# Patient Record
Sex: Male | Born: 1945 | Race: White | Hispanic: No | Marital: Married | Smoking: Former smoker
Health system: Southern US, Community
[De-identification: ages and names within clinical notes are randomized; demographics above are authoritative.]

## PROBLEM LIST (undated history)

## (undated) DIAGNOSIS — G629 Polyneuropathy, unspecified: Secondary | ICD-10-CM

## (undated) HISTORY — PX: FRACTURE SURGERY: SHX138

## (undated) HISTORY — PX: EYE SURGERY: SHX253

## (undated) HISTORY — PX: HERNIA REPAIR: SHX51

---

## 2021-08-27 ENCOUNTER — Emergency Department (HOSPITAL_COMMUNITY)
Admission: EM | Admit: 2021-08-27 | Discharge: 2021-08-27 | Disposition: A | Discharge status: Home / Self Care | Payer: Medicare Other | Attending: Emergency Medicine | Admitting: Emergency Medicine

## 2021-08-27 ENCOUNTER — Emergency Department (HOSPITAL_COMMUNITY): Payer: Medicare Other

## 2021-08-27 ENCOUNTER — Encounter (HOSPITAL_COMMUNITY): Payer: Self-pay | Admitting: Emergency Medicine

## 2021-08-27 DIAGNOSIS — G8918 Other acute postprocedural pain: Secondary | ICD-10-CM | POA: Insufficient documentation

## 2021-08-27 DIAGNOSIS — D72829 Elevated white blood cell count, unspecified: Secondary | ICD-10-CM | POA: Insufficient documentation

## 2021-08-27 DIAGNOSIS — R112 Nausea with vomiting, unspecified: Secondary | ICD-10-CM | POA: Insufficient documentation

## 2021-08-27 DIAGNOSIS — R1010 Upper abdominal pain, unspecified: Secondary | ICD-10-CM | POA: Diagnosis present

## 2021-08-27 HISTORY — DX: Polyneuropathy, unspecified: G62.9

## 2021-08-27 LAB — COMPREHENSIVE METABOLIC PANEL
ALT: 23 U/L (ref 0–44)
AST: 23 U/L (ref 15–41)
Albumin: 3.9 g/dL (ref 3.5–5.0)
Alkaline Phosphatase: 47 U/L (ref 38–126)
Anion gap: 9 (ref 5–15)
BUN: 20 mg/dL (ref 8–23)
CO2: 26 mmol/L (ref 22–32)
Calcium: 9.1 mg/dL (ref 8.9–10.3)
Chloride: 100 mmol/L (ref 98–111)
Creatinine, Ser: 0.79 mg/dL (ref 0.61–1.24)
GFR, Estimated: >60 mL/min (ref >60)
Glucose, Bld: 120 mg/dL — ABNORMAL HIGH (ref 70–99)
Potassium: 3.6 mmol/L (ref 3.5–5.1)
Sodium: 135 mmol/L (ref 135–145)
Total Bilirubin: 0.5 mg/dL (ref 0.3–1.2)
Total Protein: 7.6 g/dL (ref 6.5–8.1)

## 2021-08-27 LAB — TROPONIN I (HIGH SENSITIVITY)
Troponin I (High Sensitivity): 20 ng/L — ABNORMAL HIGH (ref <18)
Troponin I (High Sensitivity): 18 ng/L — ABNORMAL HIGH (ref <18)

## 2021-08-27 LAB — CBC WITH DIFFERENTIAL/PLATELET
Abs Immature Granulocytes: 0.04 10*3/uL (ref 0.00–0.07)
Basophils Absolute: 0 10*3/uL (ref 0.0–0.1)
Basophils Relative: 0 %
Eosinophils Absolute: 0 10*3/uL (ref 0.0–0.5)
Eosinophils Relative: 0 %
HCT: 39.5 % (ref 39.0–52.0)
Hemoglobin: 13.8 g/dL (ref 13.0–17.0)
Immature Granulocytes: 0 %
Lymphocytes Relative: 9 %
Lymphs Abs: 1.2 10*3/uL (ref 0.7–4.0)
MCH: 31.1 pg (ref 26.0–34.0)
MCHC: 34.9 g/dL (ref 30.0–36.0)
MCV: 89 fL (ref 80.0–100.0)
Monocytes Absolute: 1.3 10*3/uL — ABNORMAL HIGH (ref 0.1–1.0)
Monocytes Relative: 10 %
Neutro Abs: 10.3 10*3/uL — ABNORMAL HIGH (ref 1.7–7.7)
Neutrophils Relative %: 81 %
Platelets: 330 10*3/uL (ref 150–400)
RBC: 4.44 MIL/uL (ref 4.22–5.81)
RDW: 12.8 % (ref 11.5–15.5)
WBC: 12.8 10*3/uL — ABNORMAL HIGH (ref 4.0–10.5)
nRBC: 0 % (ref 0.0–0.2)

## 2021-08-27 LAB — LIPASE, BLOOD: Lipase: 29 U/L (ref 11–51)

## 2021-08-27 MED ORDER — FENTANYL CITRATE PF 50 MCG/ML IJ SOSY
50.0000 ug | PREFILLED_SYRINGE | Freq: Once | INTRAMUSCULAR | Status: AC
  Administered 2021-08-27: 50 ug via INTRAVENOUS
  Filled 2021-08-27: qty 1

## 2021-08-27 MED ORDER — ONDANSETRON HCL 4 MG/2ML IJ SOLN
4.0000 mg | Freq: Once | INTRAMUSCULAR | Status: AC
  Administered 2021-08-27: 4 mg via INTRAVENOUS
  Filled 2021-08-27: qty 2

## 2021-08-27 MED ORDER — MORPHINE SULFATE (PF) 4 MG/ML IV SOLN
4.0000 mg | Freq: Once | INTRAVENOUS | Status: AC
  Administered 2021-08-27: 4 mg via INTRAVENOUS
  Filled 2021-08-27: qty 1

## 2021-08-27 MED ORDER — SUCRALFATE 1 G PO TABS: 1.0000 g | ORAL_TABLET | Freq: Three times a day (TID) | ORAL | 0 refills | Status: AC

## 2021-08-27 MED ORDER — IOHEXOL 300 MG/ML  SOLN
100.0000 mL | Freq: Once | INTRAMUSCULAR | Status: AC | PRN
  Administered 2021-08-27: 100 mL via INTRAVENOUS

## 2021-08-27 MED ORDER — HYDROMORPHONE HCL 1 MG/ML IJ SOLN
1.0000 mg | Freq: Once | INTRAMUSCULAR | Status: AC
  Administered 2021-08-27: 1 mg via INTRAVENOUS
  Filled 2021-08-27: qty 1

## 2021-08-27 MED ORDER — SUCRALFATE 1 GM/10ML PO SUSP
1.0000 g | Freq: Once | ORAL | Status: AC
  Administered 2021-08-27: 1 g via ORAL
  Filled 2021-08-27: qty 10

## 2021-08-27 MED ORDER — FAMOTIDINE IN NACL 20-0.9 MG/50ML-% IV SOLN
20.0000 mg | Freq: Once | INTRAVENOUS | Status: AC
  Administered 2021-08-27: 20 mg via INTRAVENOUS
  Filled 2021-08-27: qty 50

## 2021-08-27 MED ORDER — FAMOTIDINE 20 MG PO TABS: 20.0000 mg | ORAL_TABLET | Freq: Two times a day (BID) | ORAL | 0 refills | Status: AC

## 2021-08-27 MED ORDER — SODIUM CHLORIDE 0.9 % IV BOLUS
1000.0000 mL | Freq: Once | INTRAVENOUS | Status: AC
  Administered 2021-08-27: 1000 mL via INTRAVENOUS

## 2021-08-27 NOTE — ED Provider Triage Note (Signed)
Emergency Medicine Provider Triage Evaluation Note ? ?Lasaro Primm , a 76 y.o. male  was evaluated in triage.  Pt complains of nausea/vomiting.  Patient had a laparoscopic hernia repair yesterday by Dr. Derrell Lolling.  He was able to eat dinner last night and went to bed.  He woke up around 2 AM with intractable nausea/vomiting.  Does note "dark stool colored watery vomit" but also notes that he was eating chocolate ice cream as well as beans last night.  Denies any abdominal pain and states that he has been passing gas but denies any BMs.  Reports associated chest pressure. ? ?Physical Exam  ?BP 128/86 (BP Location: Left Arm)   Pulse (!) 110   Temp 98.3 ?F (36.8 ?C)   Resp 18   SpO2 94%  ?Gen:   Awake, no distress   ?Resp:  Normal effort  ?MSK:   Moves extremities without difficulty  ?Other:   ? ?Medical Decision Making  ?Medically screening exam initiated at 3:14 PM.  Appropriate orders placed.  Jaecob Lowden was informed that the remainder of the evaluation will be completed by another provider, this initial triage assessment does not replace that evaluation, and the importance of remaining in the ED until their evaluation is complete. ?  ?Placido Sou, PA-C ?08/27/21 1515 ? ?

## 2021-08-27 NOTE — ED Provider Notes (Signed)
?[facility]-EMERGENCY DEPT ?Provider Note ? ? ?CSN: [id number] ?Arrival date & time: 08/27/21  1449 ? ?  ? ?History ? ?Chief Complaint  ?Patient presents with  ? Post-op Problem  ? ? ?Cameron Mejia is a 76 y.o. male. ? ?HPI ?Patient presents 1 day after laparoscopic hernia repair now with concern for abdominal pain nausea, vomiting. ?He was well on arriving home, but about 12 hours ago developed nausea, vomiting, upper abdominal discomfort and a burning sensation in his throat.  He has not been able to take any medication for relief. ?  ? ?Home Medications ?Prior to Admission medications   ?Not on File  ?   ? ?Allergies    ?Oxycodone and Penicillins   ? ?Review of Systems   ?Review of Systems  ?Constitutional:   ?     Per HPI, otherwise negative  ?HENT:    ?     Per HPI, otherwise negative  ?Respiratory:    ?     Per HPI, otherwise negative  ?Cardiovascular:   ?     Per HPI, otherwise negative  ?Gastrointestinal:  Positive for abdominal pain and nausea. Negative for vomiting.  ?Endocrine:  ?     Negative aside from HPI  ?Genitourinary:   ?     Neg aside from HPI   ?Musculoskeletal:   ?     Per HPI, otherwise negative  ?Skin: Negative.   ?Neurological:  Negative for syncope.  ? ?Physical Exam ?Updated Vital Signs ?BP (!) 157/72   Pulse 87   Temp 98.3 ?F (36.8 ?C)   Resp 20   SpO2 92%  ?Physical Exam ?Vitals and nursing note reviewed.  ?Constitutional:   ?   General: He is not in acute distress. ?   Appearance: He is well-developed.  ?HENT:  ?   Head: Normocephalic and atraumatic.  ?Eyes:  ?   Conjunctiva/sclera: Conjunctivae normal.  ?Cardiovascular:  ?   Rate and Rhythm: Normal rate and regular rhythm.  ?Pulmonary:  ?   Effort: Pulmonary effort is normal. No respiratory distress.  ?   Breath sounds: No stridor.  ?Abdominal:  ?   General: There is no distension.  ?   Tenderness: There is abdominal tenderness. There is guarding.  ?Skin: ?   General: Skin is warm and dry.  ?   Comments:  Laparoscopic surgical incisions clean, dry, intact Dermabond in place  ?Neurological:  ?   Mental Status: He is alert and oriented to person, place, and time.  ? ? ?ED Results / Procedures / Treatments   ?Labs ?(all labs ordered are listed, but only abnormal results are displayed) ?Labs Reviewed  ?COMPREHENSIVE METABOLIC PANEL - Abnormal; Notable for the following components:  ?    Result Value  ? Glucose, Bld 120 (*)   ? All other components within normal limits  ?CBC WITH DIFFERENTIAL/PLATELET - Abnormal; Notable for the following components:  ? WBC 12.8 (*)   ? Neutro Abs 10.3 (*)   ? Monocytes Absolute 1.3 (*)   ? All other components within normal limits  ?TROPONIN I (HIGH SENSITIVITY) - Abnormal; Notable for the following components:  ? Troponin I (High Sensitivity) 20 (*)   ? All other components within normal limits  ?TROPONIN I (HIGH SENSITIVITY) - Abnormal; Notable for the following components:  ? Troponin I (High Sensitivity) 18 (*)   ? All other components within normal limits  ?LIPASE, BLOOD  ? ? ?EKG ?EKG Interpretation ? ?Date/Time:  Friday August 27 2021 15:58:30 EDT ?Ventricular Rate:  89 ?PR Interval:  157 ?QRS Duration: 96 ?QT Interval:  357 ?QTC Calculation: 435 ?R Axis:   70 ?Text Interpretation: Sinus rhythm Baseline wander Otherwise within normal limits Confirmed by Gerhard MunchLockwood, Daylan Boggess [phone]([id number]) on 08/27/2021 4:33:13 PM ? ?Radiology ?CT Abdomen Pelvis W Contrast ? ?Result Date: 08/27/2021 ?CLINICAL DATA:  Postop abdominal pain and nausea and vomiting. 1 day postop from laparoscopic hernia repair. EXAM: CT ABDOMEN AND PELVIS WITH CONTRAST TECHNIQUE: Multidetector CT imaging of the abdomen and pelvis was performed using the standard protocol following bolus administration of intravenous contrast. RADIATION DOSE REDUCTION: This exam was performed according to the departmental dose-optimization program which includes automated exposure control, adjustment of the mA and/or kV according to patient size  and/or use of iterative reconstruction technique. CONTRAST:  100mL OMNIPAQUE IOHEXOL 300 MG/ML  SOLN COMPARISON:  None. FINDINGS: Lower Chest: Mild bibasilar atelectasis is seen. Mild pneumomediastinum also noted. Hepatobiliary: Several benign cysts are seen in the left hepatic lobe. A benign hemangioma is seen in the right hepatic lobe measuring 3.7 cm. Gallbladder is unremarkable. No evidence of biliary ductal dilatation. Pancreas:  No mass or inflammatory changes. Spleen: Within normal limits in size and appearance. Adrenals/Urinary Tract: No masses identified. Small benign left upper pole renal cyst noted (no followup imaging is recommended). No evidence of ureteral calculi or hydronephrosis. Stomach/Bowel: Small amount of postop free intraperitoneal air is noted as well as small amount of subcutaneous emphysema within the anterior abdominal wall soft tissues. Postop changes are seen from bilateral inguinal hernia repairs. No evidence of recurrent hernia hematoma, or abscess. No evidence of obstruction, inflammatory process or abnormal fluid collections. A small hiatal hernia is seen. Mild wall thickening of the distal thoracic esophagus is seen, consistent with esophagitis. Vascular/Lymphatic: No pathologically enlarged lymph nodes. No acute vascular findings. Aortic atherosclerotic calcification noted. Reproductive:  No mass or other significant abnormality. Other:  None. Musculoskeletal:  No suspicious bone lesions identified. IMPRESSION: Postop changes from bilateral inguinal hernia repairs. No evidence of recurrent hernia, hematoma, or abscess. Mild free intraperitoneal air and pneumomediastinum, attributed to recent surgery. Small hiatal hernia with esophagitis. No evidence of bowel obstruction. Benign hepatic hemangioma and cysts. Aortic Atherosclerosis (ICD10-I70.0). Electronically Signed   By: Danae OrleansJohn A Stahl M.D.   On: 08/27/2021 19:01  ? ?DG Abdomen Acute W/Chest ? ?Result Date: 08/27/2021 ?CLINICAL DATA:   Nausea and vomiting after laparoscopic hernia repair yesterday. EXAM: DG ABDOMEN ACUTE WITH 1 VIEW CHEST COMPARISON:  September 01, 2011. FINDINGS: There is no evidence of dilated bowel loops. Free air is noted underneath both hemidiaphragms consistent with recent surgery. Surgical clips are noted in the pelvis. Heart size and mediastinal contours are within normal limits. Both lungs are clear. IMPRESSION: No abnormal bowel dilatation is noted. Postsurgical changes are noted in the pelvis. Free air is under both hemidiaphragms consistent with recent surgery. Electronically Signed   By: Lupita RaiderJames  Green Jr M.D.   On: 08/27/2021 15:57   ? ?Procedures ?Procedures  ? ? ?Medications Ordered in ED ?Medications  ?sucralfate (CARAFATE) 1 GM/10ML suspension 1 g (has no administration in time range)  ?sodium chloride 0.9 % bolus 1,000 mL (0 mLs Intravenous Stopped 08/27/21 1702)  ?fentaNYL (SUBLIMAZE) injection 50 mcg (50 mcg Intravenous Given 08/27/21 1644)  ?ondansetron (ZOFRAN) injection 4 mg (4 mg Intravenous Given 08/27/21 1644)  ?morphine (PF) 4 MG/ML injection 4 mg (4 mg Intravenous Given 08/27/21 1804)  ?HYDROmorphone (DILAUDID) injection 1 mg (1 mg Intravenous Given 08/27/21 1831)  ?  famotidine (PEPCID) IVPB 20 mg premix (0 mg Intravenous Stopped 08/27/21 1901)  ?sucralfate (CARAFATE) 1 GM/10ML suspension 1 g (1 g Oral Given 08/27/21 1832)  ?iohexol (OMNIPAQUE) 300 MG/ML solution 100 mL (100 mLs Intravenous Contrast Given 08/27/21 1838)  ? ? ?ED Course/ Medical Decision Making/ A&P ?This patient with a Hx of laparoscopic hernia repair yesterday, presents to the ED for concern of nausea, vomiting, abdominal pain, this involves an extensive number of treatment options, and is a complaint that carries with it a high risk of complications and morbidity.   ? ?The differential diagnosis includes obstruction, infection, anesthesia sequelae ? ? ?Social Determinants of Health: ? ?Age ? ?Additional history obtained: ? ?Additional history  and/or information obtained from wife, notable for details of HPI as above ? ? ?After the initial evaluation, orders, including: Labs, CT, fluids, IV narcotics, antiemetics were initiated. ? ? ?Patient placed on Cardiac an

## 2021-08-27 NOTE — Discharge Instructions (Signed)
As discussed, your evaluation today has been largely reassuring.  But, it is important that you monitor your condition carefully, and do not hesitate to return to the ED if you develop new, or concerning changes in your condition. ? ?Otherwise, please follow-up with your physician for appropriate ongoing care. ? ?

## 2021-08-27 NOTE — ED Notes (Signed)
Pt placed on 3L [place] after de sat after pain medication ? ?

## 2022-11-08 IMAGING — CR DG ABDOMEN ACUTE W/ 1V CHEST
4 series · 4 of 4 positions shown · non-contrast
Comparison: September 01, 2011.

CLINICAL DATA: Nausea and vomiting after laparoscopic hernia repair
yesterday.

EXAM:
DG ABDOMEN ACUTE WITH 1 VIEW CHEST

[w chest pa]
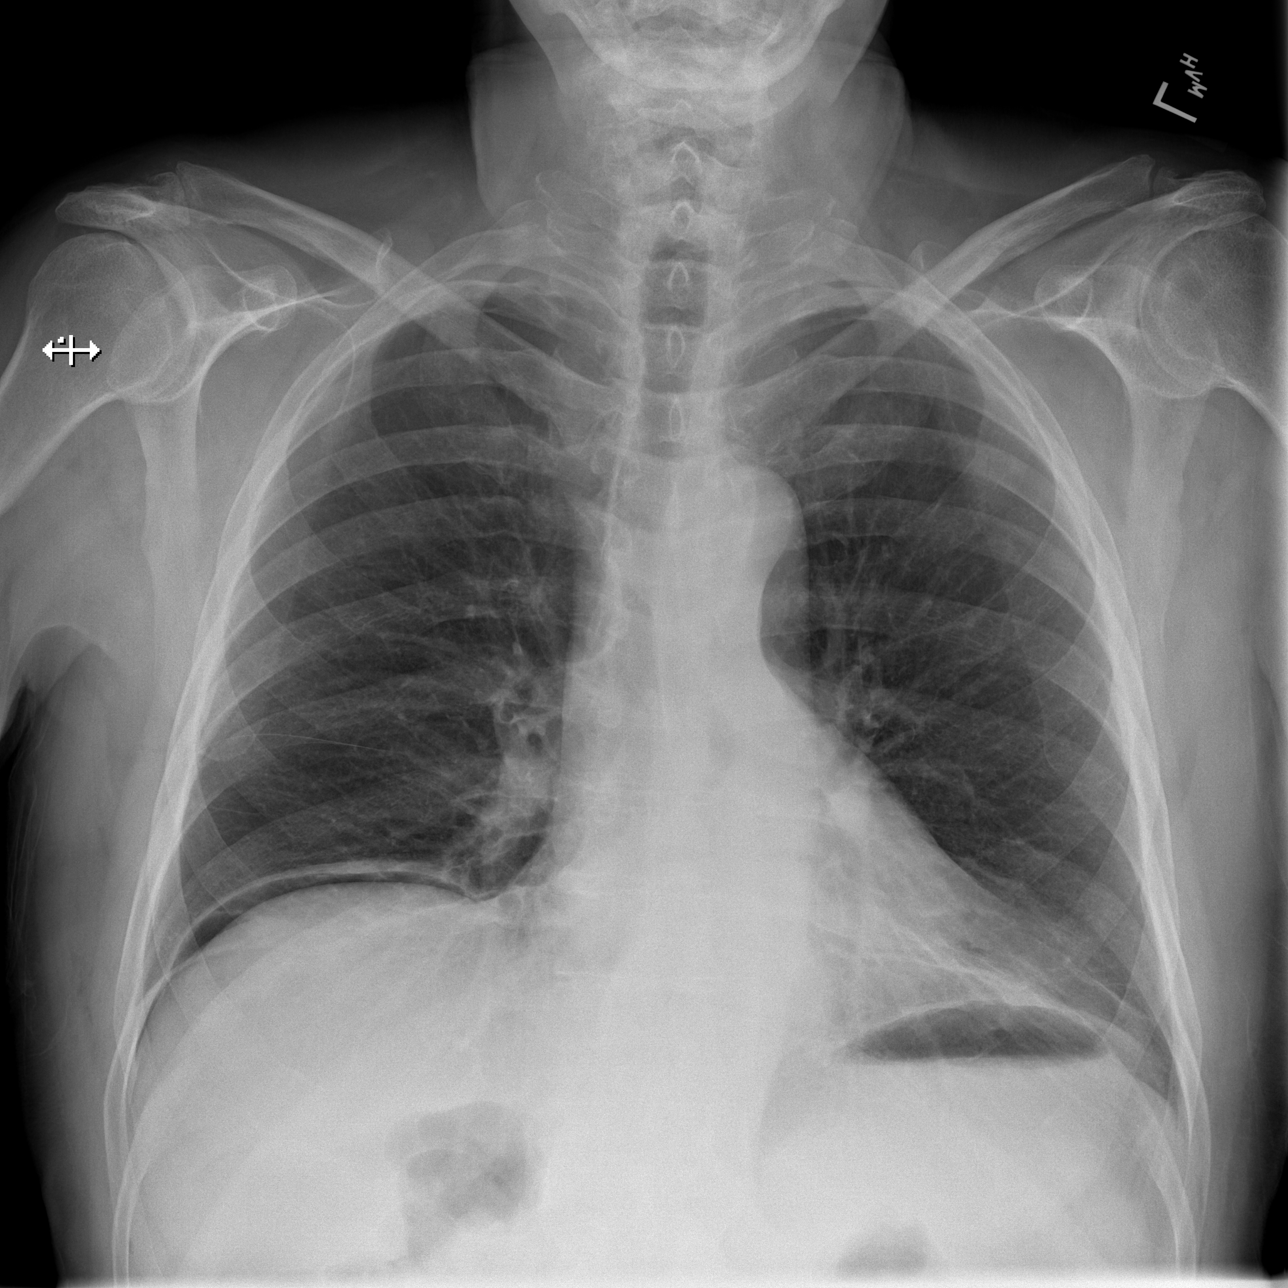

[w abdomen upright]
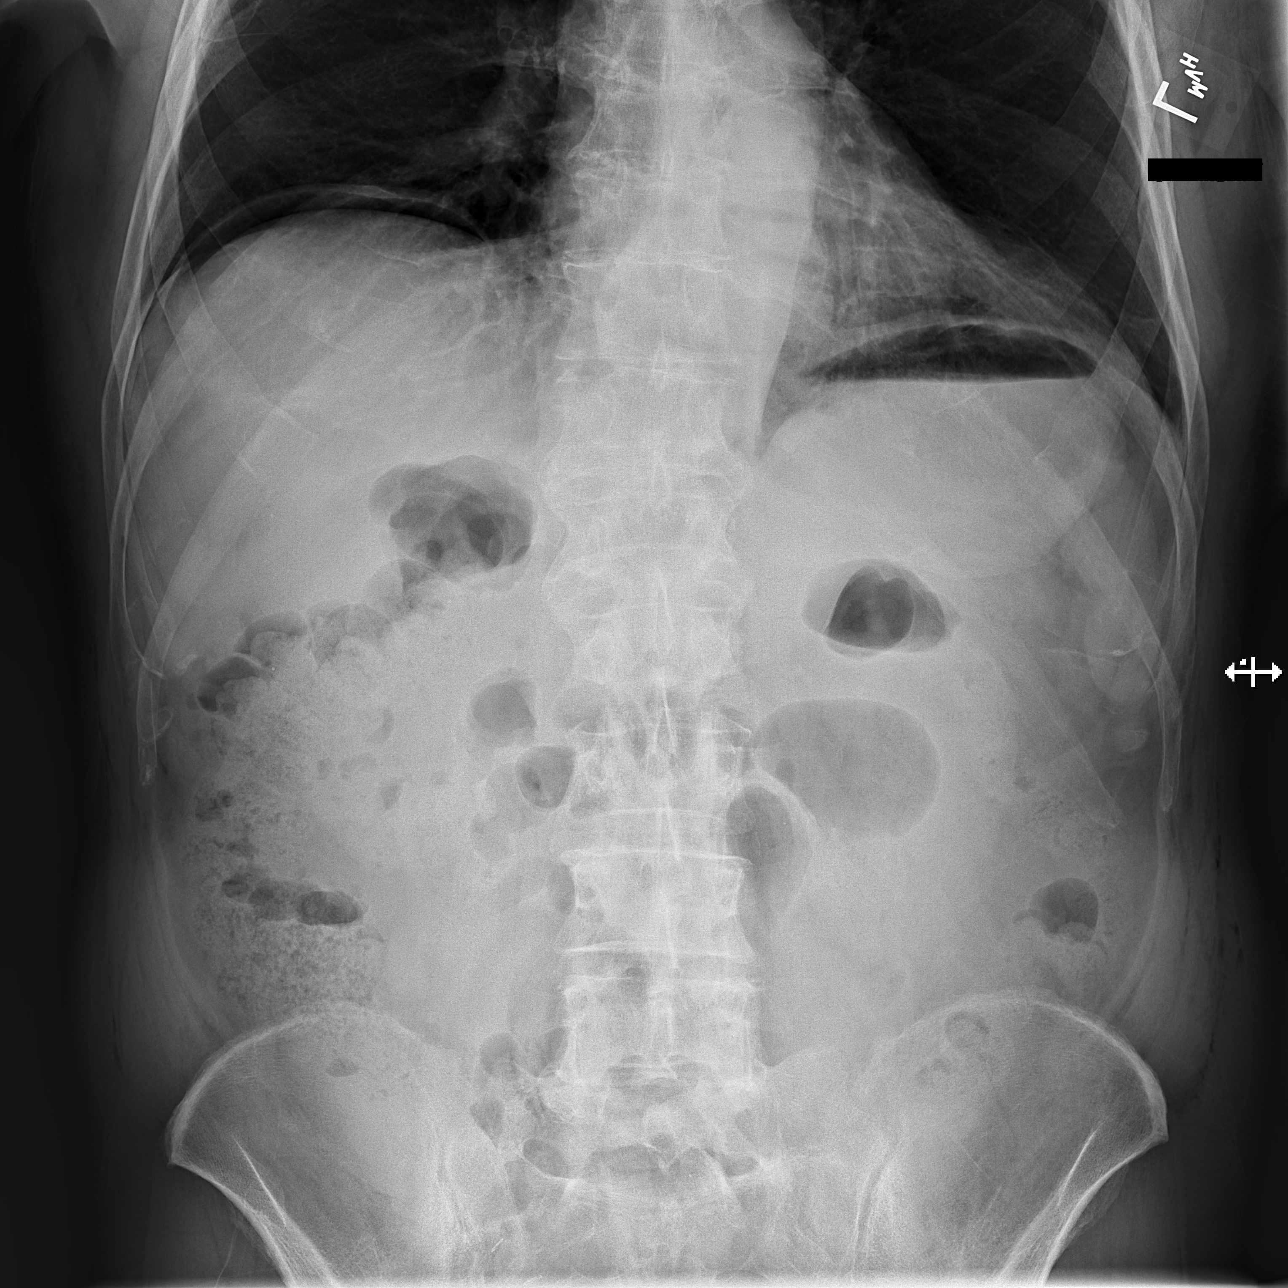

[t abdomen supine (1 of 2)]
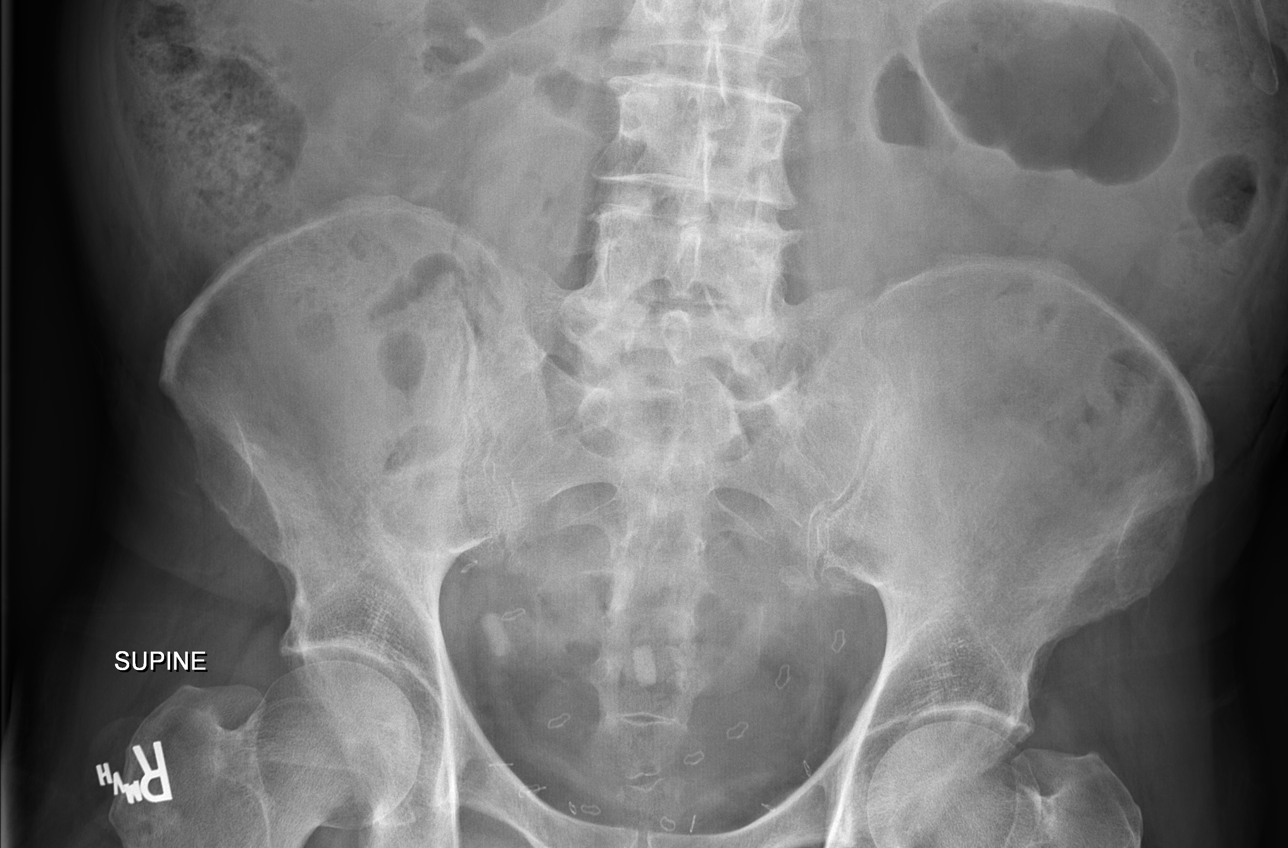

[t abdomen supine (2 of 2)]
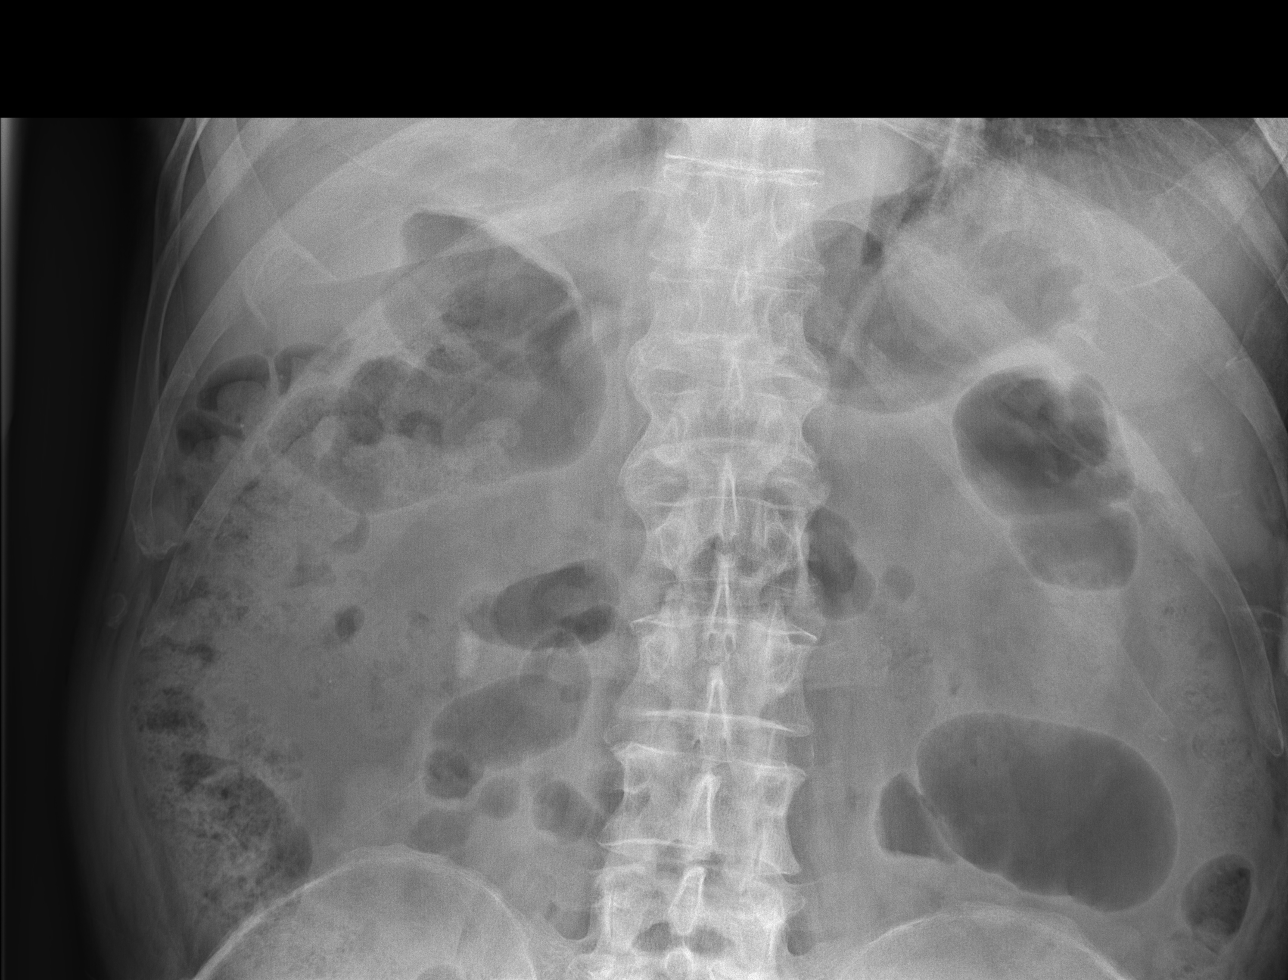

[4 of 4 positions shown; findings below may reference images not displayed]

FINDINGS: There is no evidence of dilated bowel loops. Free air is noted
underneath both hemidiaphragms consistent with recent surgery.
Surgical clips are noted in the pelvis. Heart size and mediastinal
contours are within normal limits. Both lungs are clear.
IMPRESSION: No abnormal bowel dilatation is noted. Postsurgical changes are
noted in the pelvis. Free air is under both hemidiaphragms
consistent with recent surgery.
